# Patient Record
Sex: Male | Born: 1990 | Race: Black or African American | Hispanic: No | Marital: Married | Smoking: Never smoker
Health system: Southern US, Community
[De-identification: ages and names within clinical notes are randomized; demographics above are authoritative.]

---
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
## 2013-07-04 ENCOUNTER — Encounter (HOSPITAL_COMMUNITY): Payer: Self-pay | Admitting: Emergency Medicine

## 2013-07-04 ENCOUNTER — Emergency Department (HOSPITAL_COMMUNITY)
Admission: EM | Admit: 2013-07-04 | Discharge: 2013-07-05 | Disposition: A | Discharge status: Home / Self Care | Payer: Self-pay | Attending: Emergency Medicine | Admitting: Emergency Medicine

## 2013-07-04 ENCOUNTER — Emergency Department (HOSPITAL_COMMUNITY): Payer: Self-pay

## 2013-07-04 DIAGNOSIS — Y9389 Activity, other specified: Secondary | ICD-10-CM | POA: Insufficient documentation

## 2013-07-04 DIAGNOSIS — S4980XA Other specified injuries of shoulder and upper arm, unspecified arm, initial encounter: Secondary | ICD-10-CM | POA: Insufficient documentation

## 2013-07-04 DIAGNOSIS — Y92410 Unspecified street and highway as the place of occurrence of the external cause: Secondary | ICD-10-CM | POA: Insufficient documentation

## 2013-07-04 DIAGNOSIS — S4990XA Unspecified injury of shoulder and upper arm, unspecified arm, initial encounter: Secondary | ICD-10-CM

## 2013-07-04 DIAGNOSIS — S46909A Unspecified injury of unspecified muscle, fascia and tendon at shoulder and upper arm level, unspecified arm, initial encounter: Secondary | ICD-10-CM | POA: Insufficient documentation
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
## 2013-07-05 MED ORDER — IBUPROFEN 600 MG PO TABS: 600.0000 mg | ORAL_TABLET | Freq: Four times a day (QID) | ORAL | Status: AC | PRN

## 2013-07-05 MED ORDER — IBUPROFEN 400 MG PO TABS
800.0000 mg | ORAL_TABLET | Freq: Once | ORAL | Status: AC
  Administered 2013-07-05: 800 mg via ORAL
  Filled 2013-07-05: qty 2

## 2013-07-05 MED ORDER — CYCLOBENZAPRINE HCL 10 MG PO TABS: 10.0000 mg | ORAL_TABLET | Freq: Two times a day (BID) | ORAL | Status: AC | PRN

## 2013-07-05 MED ORDER — TRAMADOL HCL 50 MG PO TABS: 50.0000 mg | ORAL_TABLET | Freq: Four times a day (QID) | ORAL | Status: AC | PRN

## 2013-07-05 NOTE — Discharge Instructions (Signed)
Acromioclavicular Injuries  The AC (acromioclavicular) joint is the joint in the shoulder where the collarbone (clavicle) meets the shoulder blade (scapula). The part of the shoulder blade connected to the collarbone is called the acromion. Common problems with and treatments for the AC joint are detailed below.  ARTHRITIS  Arthritis occurs when the joint has been injured and the smooth padding between the joints (cartilage) is lost. This is the wear and tear seen in most joints of the body if they have been overused. This causes the joint to produce pain and swelling which is worse with activity.   AC JOINT SEPARATION  AC joint separation means that the ligaments connecting the acromion of the shoulder blade and collarbone have been damaged, and the two bones no longer line up. AC separations can be anywhere from mild to severe, and are "graded" depending upon which ligaments are torn and how badly they are torn.   Grade I Injury: the least damage is done, and the AC joint still lines up.   Grade II Injury: damage to the ligaments which reinforce the AC joint. In a Grade II injury, these ligaments are stretched but not entirely torn. When stressed, the AC joint becomes painful and unstable.   Grade III Injury: AC and secondary ligaments are completely torn, and the collarbone is no longer attached to the shoulder blade. This results in deformity; a prominence of the end of the clavicle.  AC JOINT FRACTURE  AC joint fracture means that there has been a break in the bones of the AC joint, usually the end of the clavicle.  TREATMENT  TREATMENT OF AC ARTHRITIS   There is currently no way to replace the cartilage damaged by arthritis. The best way to improve the condition is to decrease the activities which aggravate the problem. Application of ice to the joint helps decrease pain and soreness (inflammation). The use of non-steroidal anti-inflammatory medication is helpful.   If less conservative measures do not  work, then cortisone shots (injections) may be used. These are anti-inflammatories; they decrease the soreness in the joint and swelling.   If non-surgical measures fail, surgery may be recommended. The procedure is generally removal of a portion of the end of the clavicle. This is the part of the collarbone closest to your acromion which is stabilized with ligaments to the acromion of the shoulder blade. This surgery may be performed using a tube-like instrument with a light (arthroscope) for looking into a joint. It may also be performed as an open surgery through a small incision by the surgeon. Most patients will have good range of motion within 6 weeks and may return to all activity including sports by 8-12 weeks, barring complications.  TREATMENT OF AN AC SEPARATION   The initial treatment is to decrease pain. This is best accomplished by immobilizing the arm in a sling and placing an ice pack to the shoulder for 20 to 30 minutes every 2 hours as needed. As the pain starts to subside, it is important to begin moving the fingers, wrist, elbow and eventually the shoulder in order to prevent a stiff or "frozen" shoulder. Instruction on when and how much to move the shoulder will be provided by your caregiver. The length of time needed to regain full motion and function depends on the amount or grade of the injury. Recovery from a Grade I AC separation usually takes 10 to 14 days, whereas a Grade III may take 6 to 8 weeks.   Grade   I and II separations usually do not require surgery. Even Grade III injuries usually allow return to full activity with few restrictions. Treatment is also based on the activity demands of the injured shoulder. For example, a high level quarterback with an injured throwing arm will receive more aggressive treatment than someone with a desk job who rarely uses his/her arm for strenuous activities. In some cases, a painful lump may persist which could require a later surgery. Surgery  can be very successful, but the benefits must be weighed against the potential risks.  TREATMENT OF AN AC JOINT FRACTURE  Fracture treatment depends on the type of fracture. Sometimes a splint or sling may be all that is required. Other times surgery may be required for repair. This is more frequently the case when the ligaments supporting the clavicle are completely torn. Your caregiver will help you with these decisions and together you can decide what will be the best treatment.  HOME CARE INSTRUCTIONS    Apply ice to the injury for 15-20 minutes each hour while awake for 2 days. Put the ice in a plastic bag and place a towel between the bag of ice and skin.   If a sling has been applied, wear it constantly for as long as directed by your caregiver, even at night. The sling or splint can be removed for bathing or showering or as directed. Be sure to keep the shoulder in the same place as when the sling is on. Do not lift the arm.   If a figure-of-eight splint has been applied it should be tightened gently by another person every day. Tighten it enough to keep the shoulders held back. Allow enough room to place the index finger between the body and strap. Loosen the splint immediately if there is numbness or tingling in the hands.   Take over-the-counter or prescription medicines for pain, discomfort or fever as directed by your caregiver.   If you or your child has received a follow up appointment, it is very important to keep that appointment in order to avoid long term complications, chronic pain or disability.  SEEK MEDICAL CARE IF:    The pain is not relieved with medications.   There is increased swelling or discoloration that continues to get worse rather than better.   You or your child has been unable to follow up as instructed.   There is progressive numbness and tingling in the arm, forearm or hand.  SEEK IMMEDIATE MEDICAL CARE IF:    The arm is numb, cold or pale.   There is increasing pain  in the hand, forearm or fingers.  MAKE SURE YOU:    Understand these instructions.   Will watch your condition.   Will get help right away if you are not doing well or get worse.  Document Released: 11/01/2004 Document Revised: 04/16/2011 Document Reviewed: 04/26/2008  [employer] Patient Information 2014 [employer]

## 2013-07-05 NOTE — ED Provider Notes (Signed)
Medical screening examination/treatment/procedure(s) were performed by non-physician practitioner and as supervising physician I was immediately available for consultation/collaboration.   EKG Interpretation None        Haiden Rawlinson, MD 07/05/13 0744 
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
